# Patient Record
Sex: Male | Born: 1985 | Race: White | Hispanic: No | Marital: Single | Smoking: Current every day smoker
Health system: Southern US, Community
[De-identification: ages and names within clinical notes are randomized; demographics above are authoritative.]

## PROBLEM LIST (undated history)

## (undated) DIAGNOSIS — F909 Attention-deficit hyperactivity disorder, unspecified type: Secondary | ICD-10-CM

## (undated) HISTORY — PX: KNEE SURGERY: SHX244

## (undated) HISTORY — PX: HERNIA REPAIR: SHX51

---
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
## 2010-02-10 ENCOUNTER — Encounter
Admission: RE | Admit: 2010-02-10 | Discharge: 2010-02-10 | Payer: Self-pay | Source: Home / Self Care | Attending: Family Medicine | Admitting: Family Medicine

## 2010-02-22 ENCOUNTER — Encounter: Payer: Self-pay | Admitting: Family Medicine

## 2010-06-09 NOTE — Op Note (Signed)
NAME:  Lance Stewart, AUTHEMENT NO.:  [id number]   MEDICAL RECORD NO.:  [id number]                   PATIENT TYPE:  AMB   LOCATION:  DSC                                  FACILITY:  [facility]   PHYSICIAN:  Loreta Ave, M.D.              DATE OF BIRTH:  [birth year]   DATE OF PROCEDURE:  10/23/2002  DATE OF DISCHARGE:                                 OPERATIVE REPORT   PREOPERATIVE DIAGNOSES:  1. Acute patellofemoral dislocation with traumatic chondral injury, patella     left knee.  2. Torn medial patellofemoral ligament near the femoral attachment.     Underlying ligamentous laxity.  3. Status post  previous  anterior cruciate ligament repair with good     integrity and stability of remaining cruciate ligament.  4. Numerous chondral loose bodies.   POSTOPERATIVE DIAGNOSES:  1. Acute patellofemoral dislocation with traumatic chondral injury, patella     left knee.  2. Torn medial patellofemoral ligament near the femoral attachment.     Underlying ligamentous laxity.  3. Status post  previous  anterior cruciate ligament repair with good     integrity and stability of remaining cruciate ligament.  4. Numerous chondral loose bodies.  5. Evidence of significant grade 3 chondral injury to the medial patellar     facet  with numerous chondral loose bodies.   PROCEDURE PERFORMED:  1. Left knee exam under anesthesia.  2. Arthroscopy removal of chondral loose bodies with chondroplasty.  3. Assessment of the anterior cruciate ligament.  4. Open repair of medial patellofemoral ligament near femoral attachment,     anatomic position  with nonabsorbable Ethibond suture.   SURGEON:  Loreta Ave, M.D.   ASSISTANT:  Arlys John D. Petrarca, P.A.-C.   ANESTHESIA:  General.   ESTIMATED BLOOD LOSS:  Minimal.   TOURNIQUET TIME:  1 hours.   SPECIMENS:  No specimens, no cultures.   DRESSING:  Sterile compressive knee immobilizer.   DESCRIPTION OF PROCEDURE:  The  patient was brought to the operating room and  placed on the operating table in the supine position. After adequate  anesthesia had been obtained both knees were examined. Although he has  increased excursion with testing of all ligaments, as well as recurvatum,  both knees, from generalized ligamentous laxity, both knees could be easily  assessed. Patella alta with subluximal patellofemoral joints, both knees,  much more pronounced due to the acute injury left knee. Negative Lachman and  drawer but fair amount of excursion before endpoint on ACL testing both  sides. Collateral stable both sides. Patellofemoral instability with tearing  of the medial structures of the left knee fairly obvious as well as crepitus  on patellofemoral assessing left knee.   A tourniquet was applied to the upper  aspect of the left leg. Prepped and  draped in the usual sterile fashion. Exsanguinated with elevation of the  Esmarch tourniquet  elevated to 300 mmHg. Three portals were created, 1  superolateral, 1 each medial and lateral peripatellar. The inflow catheter  was introduced ____________.   Numerous chondral loose bodies were treated throughout. A grade 3 injury to  the medial patellar facet and peak of the patella. Thorough chondroplasty to  a stable surface. Thinning but still  remaining articular cartilage  throughout and no bony injury. Grade 2 lesion  in the middle of the  trochlea. The remaining structures were assessed. Menisci intact. Medial and  lateral  compartments intact. The ACL had good integrity, fixation and  stability as did the PCL. Obvious lateral tracking but did not significantly  tether laterally. The trochlear groove was relatively flat.   Once I had good confirmation of pathology the instruments and fluid were  removed. A longitudinal incision along the femoral attachment of the  patellofemoral medially. The skin and subcutaneous tissue and deep fascia  were  incised. The  medial retinaculum and patellofemoral ligament were  exposed. This was mobilized. There was still soft tissue  margin in front of  the medial collateral ligament to allow primary soft tissue repair.   A series of 4 Ethibond sutures were placed in the medial patellofemoral  ligament and in the anterior aspect of the medial collateral ligament and  periosteum. These were all placed in a Bunnell manner to capture the  patellofemoral ligament and then firmly tied in place, restoring good  stability on the medial side. I could bring up to 90 degrees of flexion  without undue tension. Marked improvement of patellofemoral stability  returning it to normal tracking. The overlying fascia was closed with  Vicryl.   The arthroscope was  reintroduced. Confirmed all loose bodies had been  removed and also confirmed good positioning of the patellofemoral joint  after a paramedial retinaculum obliterating the traumatic instability that  had occurred with this dislocation. The instruments and fluid were removed.   The portals and knee and incision were injected with Marcaine. The portals  were closed with 4-0 nylon. The incision was closed with subcutaneous  subcuticular Vicryl and Steri-Strips. A sterile compressive dressing was  applied. The tourniquet was deflated and removed. A knee immobilizer was  applied.   Anesthesia was reversed. The patient was brought to the recovery room having  tolerated  the procedure well without complications.                                                Loreta Ave, M.D.    DFM/MEDQ  D:  10/23/2002  T:  10/24/2002  Job:  [id number]

## 2010-07-14 ENCOUNTER — Emergency Department (HOSPITAL_COMMUNITY): Payer: No Typology Code available for payment source

## 2010-07-14 ENCOUNTER — Emergency Department (HOSPITAL_COMMUNITY)
Admission: EM | Admit: 2010-07-14 | Discharge: 2010-07-14 | Disposition: A | Discharge status: Home / Self Care | Payer: No Typology Code available for payment source | Attending: Emergency Medicine | Admitting: Emergency Medicine

## 2010-07-14 DIAGNOSIS — S0990XA Unspecified injury of head, initial encounter: Secondary | ICD-10-CM | POA: Insufficient documentation

## 2010-07-14 DIAGNOSIS — R51 Headache: Secondary | ICD-10-CM | POA: Insufficient documentation

## 2010-07-14 DIAGNOSIS — R404 Transient alteration of awareness: Secondary | ICD-10-CM | POA: Insufficient documentation

## 2010-07-14 DIAGNOSIS — S0180XA Unspecified open wound of other part of head, initial encounter: Secondary | ICD-10-CM | POA: Insufficient documentation

## 2010-07-14 DIAGNOSIS — S0510XA Contusion of eyeball and orbital tissues, unspecified eye, initial encounter: Secondary | ICD-10-CM | POA: Insufficient documentation

## 2011-12-15 IMAGING — US US PELVIS LIMITED
1 series · 14 of 24 positions shown · non-contrast
Comparison: None

CLINICAL DATA: Urinary frequency.  Pressure, intermittent pain.
Symptoms for 1 year.  Post void residual requested.

US PELVIS LIMITED OR FOLLOW UP

[Series 1: us pelvis limited · 0.22mm/px · 14 of 24 slices shown]
[im 1/24]
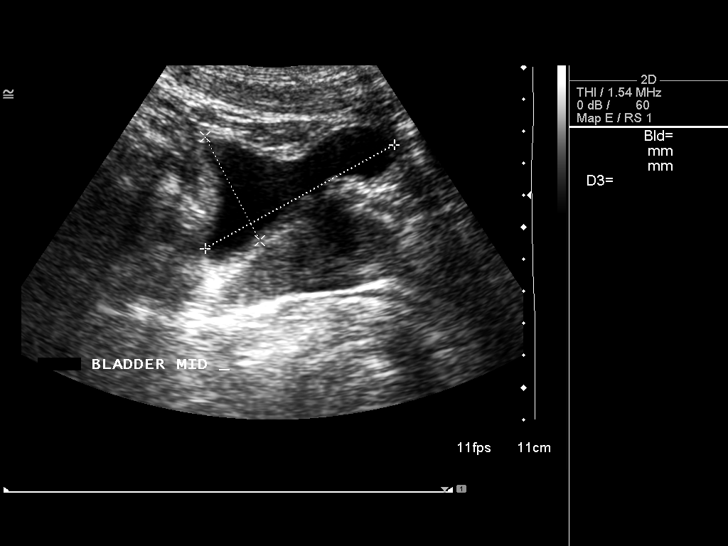
[im 3/24]
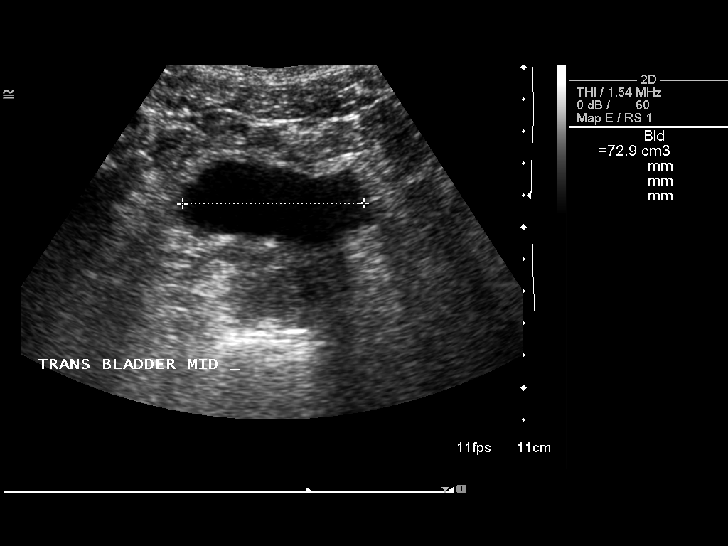
[im 5/24]
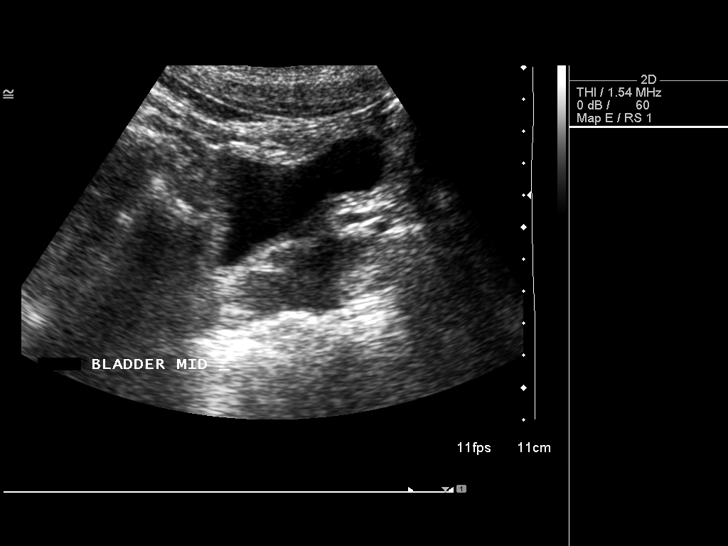
[im 7/24]
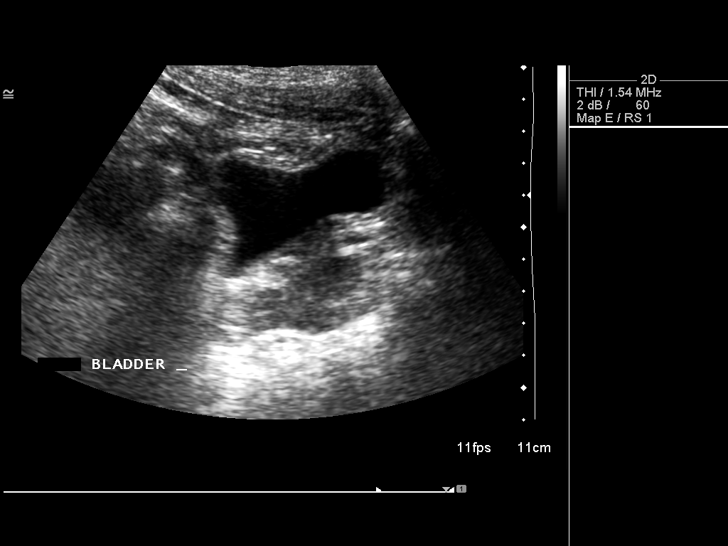
[im 8/24]
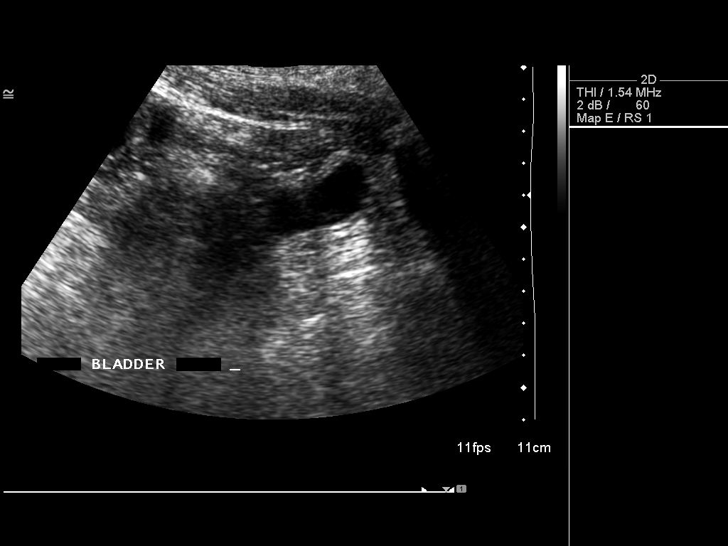
[im 10/24]
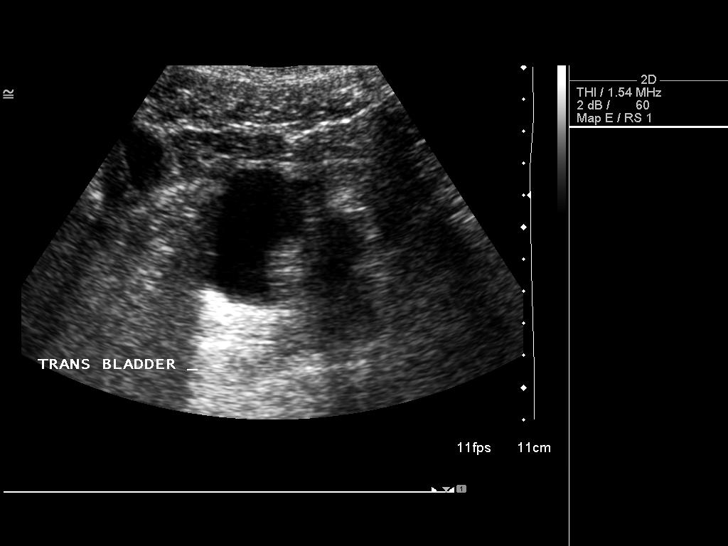
[im 12/24]
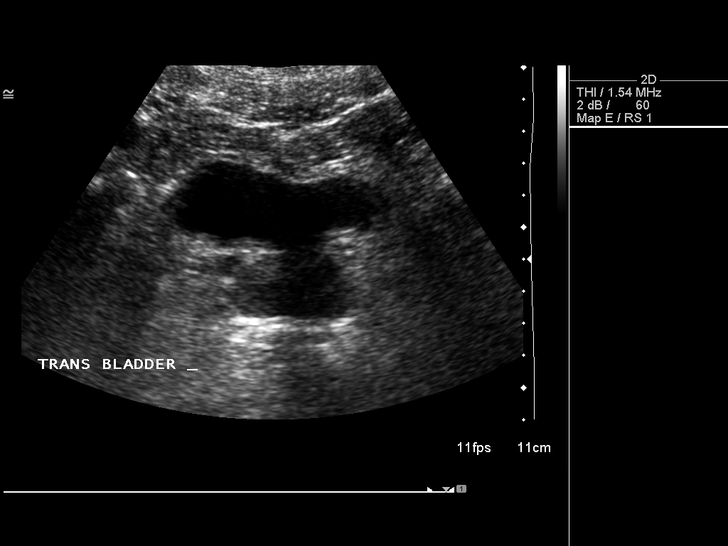
[im 13/24]
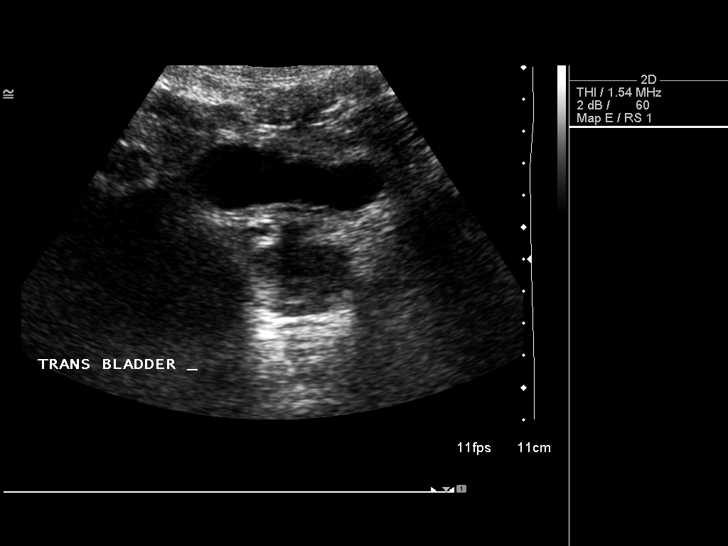
[im 15/24]
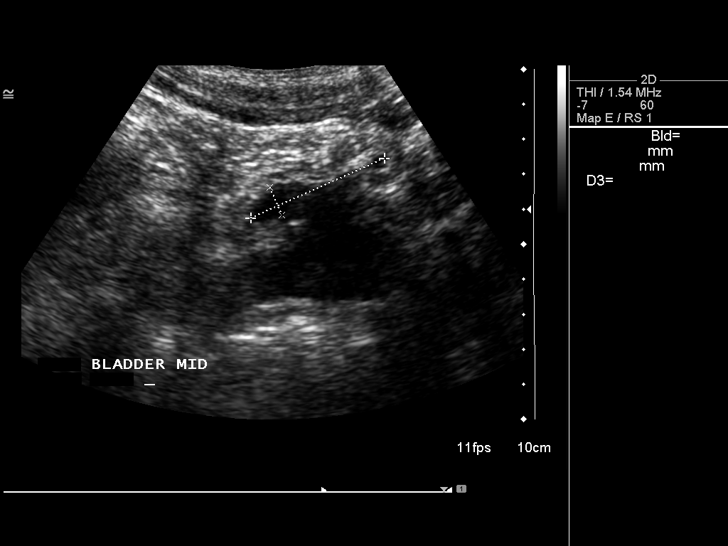
[im 17/24]
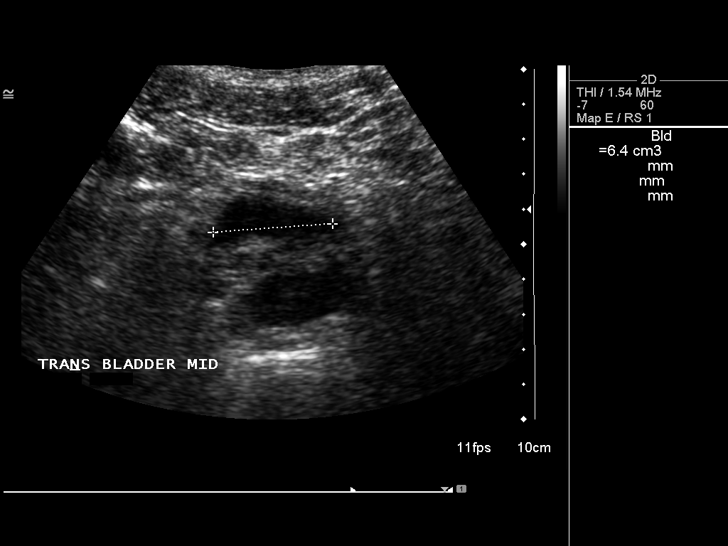
[im 19/24]
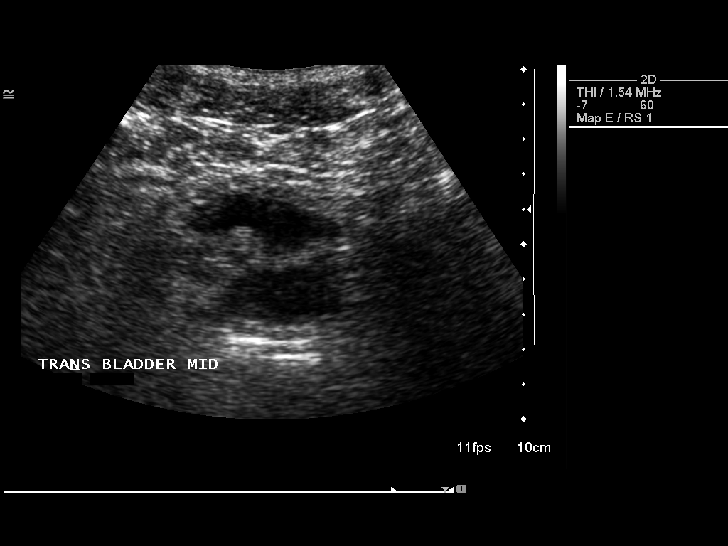
[im 20/24]
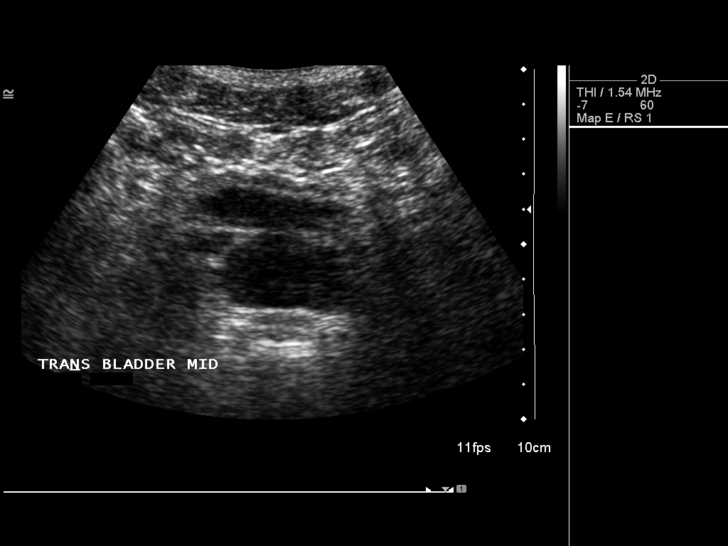
[im 22/24]
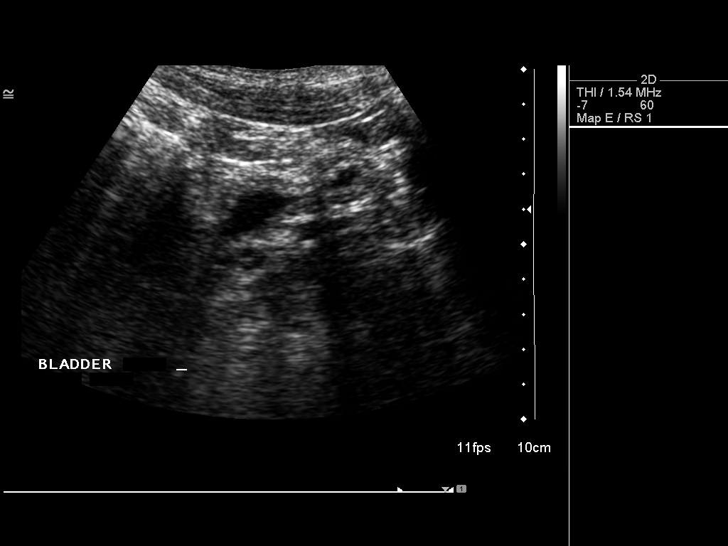
[im 24/24]
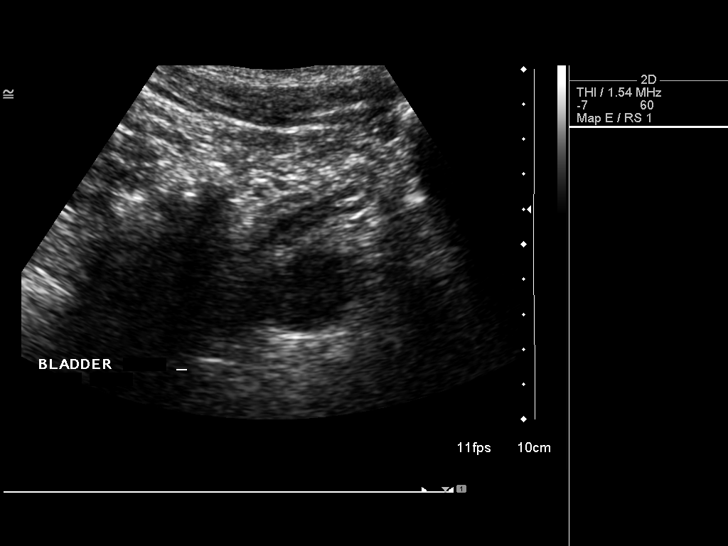

[14 of 24 positions shown; findings below may reference images not displayed]

FINDINGS: Images of the bladder are performed prior to and
following voiding.  The bladder is incompletely distended prior to
voiding.

Prior to voiding, the bladder volume is 73 ml.  Following voiding,
the bladder volume is 6 ml.

No mass is visualized.
IMPRESSION: 1.  Postvoid residual is 6 ml.
2.  No bladder abnormality identified.
3.  Bladder is not completely distended prior to voiding.

## 2012-09-17 ENCOUNTER — Encounter (HOSPITAL_COMMUNITY): Payer: Self-pay

## 2012-09-17 ENCOUNTER — Emergency Department (HOSPITAL_COMMUNITY)
Admission: EM | Admit: 2012-09-17 | Discharge: 2012-09-18 | Disposition: A | Discharge status: Home / Self Care | Payer: Self-pay | Attending: Emergency Medicine | Admitting: Emergency Medicine

## 2012-09-17 DIAGNOSIS — Y92009 Unspecified place in unspecified non-institutional (private) residence as the place of occurrence of the external cause: Secondary | ICD-10-CM | POA: Insufficient documentation

## 2012-09-17 DIAGNOSIS — T401X4A Poisoning by heroin, undetermined, initial encounter: Secondary | ICD-10-CM | POA: Insufficient documentation

## 2012-09-17 DIAGNOSIS — T401X1A Poisoning by heroin, accidental (unintentional), initial encounter: Secondary | ICD-10-CM | POA: Insufficient documentation

## 2012-09-17 DIAGNOSIS — Y9389 Activity, other specified: Secondary | ICD-10-CM | POA: Insufficient documentation

## 2012-09-17 DIAGNOSIS — F17200 Nicotine dependence, unspecified, uncomplicated: Secondary | ICD-10-CM | POA: Insufficient documentation

## 2012-09-17 DIAGNOSIS — Z8659 Personal history of other mental and behavioral disorders: Secondary | ICD-10-CM | POA: Insufficient documentation

## 2012-09-17 DIAGNOSIS — F1010 Alcohol abuse, uncomplicated: Secondary | ICD-10-CM | POA: Insufficient documentation

## 2012-09-17 HISTORY — DX: Attention-deficit hyperactivity disorder, unspecified type: F90.9

## 2012-09-17 LAB — CBC WITH DIFFERENTIAL/PLATELET
Basophils Absolute: 0 10*3/uL (ref 0.0–0.1)
Basophils Relative: 0 % (ref 0–1)
Eosinophils Absolute: 0.1 10*3/uL (ref 0.0–0.7)
Eosinophils Relative: 2 % (ref 0–5)
Lymphocytes Relative: 17 % (ref 12–46)
MCH: 29 pg (ref 26.0–34.0)
MCHC: 35.2 g/dL (ref 30.0–36.0)
MCV: 82.3 fL (ref 78.0–100.0)
Platelets: 167 10*3/uL (ref 150–400)
RDW: 13.2 % (ref 11.5–15.5)
WBC: 6.2 10*3/uL (ref 4.0–10.5)

## 2012-09-17 LAB — BASIC METABOLIC PANEL
Calcium: 9 mg/dL (ref 8.4–10.5)
GFR calc Af Amer: >90 mL/min (ref >90)
GFR calc non Af Amer: >90 mL/min (ref >90)
Sodium: 136 mEq/L (ref 135–145)

## 2012-09-17 LAB — RAPID URINE DRUG SCREEN, HOSP PERFORMED
Cocaine: NOT DETECTED
Opiates: POSITIVE — AB

## 2012-09-17 MED ORDER — PROMETHAZINE HCL 25 MG PO TABS: 25.0000 mg | ORAL_TABLET | Freq: Four times a day (QID) | ORAL | Status: DC | PRN

## 2012-09-17 MED ORDER — LOPERAMIDE HCL 2 MG PO CAPS: 2.0000 mg | ORAL_CAPSULE | Freq: Four times a day (QID) | ORAL | Status: DC | PRN

## 2012-09-17 NOTE — ED Provider Notes (Signed)
CSN: [id number]     Arrival date & time 09/17/12  2053 History   First MD Initiated Contact with Patient 09/17/12 2112     Chief Complaint  Patient presents with  . Drug Overdose   (Consider location/radiation/quality/duration/timing/severity/associated sxs/prior Treatment) HPI  Lance Stewart is a 27 y.o. male no significant past medical history brought in by EMS for heroin overdose. Patient was found laying on the floor at home with agonal respirations by his girlfriend. EMS gave 2 mg of Narcan in route with improvement. Patient states that this is his first time ever using heroin, he states he injected it. Denies any other illicit drug use, excessive alcohol use. Patient is adamant that this was not a suicide attempt patient denies any prior suicide attempt.   Past Medical History  Diagnosis Date  . ADHD (attention deficit hyperactivity disorder)    Past Surgical History  Procedure Laterality Date  . Knee surgery     No family history on file. History  Substance Use Topics  . Smoking status: Current Every Day Smoker    Types: Cigarettes  . Smokeless tobacco: Not on file  . Alcohol Use: Yes   Review of Systems  10 systems reviewed and found to be negative, except as noted in the HPI   Allergies  Review of patient's allergies indicates not on file.  Home Medications  No current outpatient prescriptions on file. BP 140/78  Pulse 107  Temp(Src) 98.6 F (37 C) (Oral)  Resp 12  Ht 6\' 1"  (1.854 m)  Wt 190 lb (86.183 kg)  BMI 25.07 kg/m2  SpO2 100% Physical Exam  Nursing note and vitals reviewed. Constitutional: He is oriented to person, place, and time. He appears well-developed and well-nourished. No distress.  HENT:  Head: Normocephalic and atraumatic.  Mouth/Throat: Oropharynx is clear and moist.  Eyes: Conjunctivae and EOM are normal.  Pupils constricted but reactive  Neck: Normal range of motion.  Cardiovascular: Normal rate, regular rhythm and normal heart  sounds.   Pulmonary/Chest: Effort normal and breath sounds normal. No stridor. No respiratory distress. He has no wheezes. He has no rales. He exhibits no tenderness.  Abdominal: Soft. Bowel sounds are normal. He exhibits no distension and no mass. There is no tenderness. There is no rebound and no guarding.  Musculoskeletal: Normal range of motion.  Neurological: He is alert and oriented to person, place, and time.  GCS 15  Psychiatric: He has a normal mood and affect.    ED Course  Procedures (including critical care time) Labs Review Labs Reviewed  BASIC METABOLIC PANEL - Abnormal; Notable for the following:    Potassium 3.2 (*)    Glucose, Bld 196 (*)    All other components within normal limits  URINE RAPID DRUG SCREEN (HOSP PERFORMED) - Abnormal; Notable for the following:    Opiates POSITIVE (*)    Amphetamines POSITIVE (*)    Tetrahydrocannabinol POSITIVE (*)    All other components within normal limits  SALICYLATE LEVEL - Abnormal; Notable for the following:    Salicylate Lvl <2.0 (*)    All other components within normal limits  CBC WITH DIFFERENTIAL  ACETAMINOPHEN LEVEL   Imaging Review  Date: 09/18/2012  Rate:106  Rhythm: Sinus tach  QRS Axis: normal  Intervals: normal  ST/T Wave abnormalities: normal  Conduction Disutrbances:none  Narrative Interpretation:   Old EKG Reviewed: Unavailable  No results found.  MDM   1. Heroin overdose, initial encounter    Filed Vitals:  09/17/12 2105  BP: 140/78  Pulse: 107  Temp: 98.6 F (37 C)  TempSrc: Oral  Resp: 12  Height: 6\' 1"  (1.854 m)  Weight: 190 lb (86.183 kg)  SpO2: 100%     Lance Stewart is a 27 y.o. male accidental overdose of IV heroin earlier in the day. Reversed with Narcan. Patient denies suicidal ideation. Urine drug screen shows opiates amphetamines and THC.  Patient has a mild sinus tachycardia on EKG, he is alert, oriented x3 with a GCS of 15. Patient is tolerating by mouth and and without  assistance. He is in the care of his father whom he will be discharged home with.  Discussed case with attending who agrees with plan and stability to d/c to home.   Pt is hemodynamically stable, appropriate for, and amenable to discharge at this time. Pt verbalized understanding and agrees with care plan. All questions answered. Outpatient follow-up and specific return precautions discussed.    New Prescriptions   LOPERAMIDE (IMODIUM) 2 MG CAPSULE    Take 1 capsule (2 mg total) by mouth 4 (four) times daily as needed for diarrhea or loose stools.   PROMETHAZINE (PHENERGAN) 25 MG TABLET    Take 1 tablet (25 mg total) by mouth every 6 (six) hours as needed for nausea.    Note: Portions of this report may have been transcribed using voice recognition software. Every effort was made to ensure accuracy; however, inadvertent computerized transcription errors may be present      Wynetta Emery, PA-C 09/18/12 [phone]

## 2012-09-17 NOTE — ED Notes (Signed)
Pt found by EMS lying in floor of home with agonal respirations. Found by girlfriend. Given 2mg  of narcan and became responsive. Admit to using heroin, first time user.

## 2012-09-17 NOTE — ED Notes (Signed)
Pt tried to urinate and was not successful.  Will try again in 30 minutes.

## 2012-09-17 NOTE — ED Notes (Signed)
Bed: ZO10 Expected date:  Expected time:  Means of arrival:  Comments: EMS 26yo M; Heroin Overdose

## 2012-09-18 ENCOUNTER — Encounter (HOSPITAL_COMMUNITY): Payer: Self-pay

## 2012-09-18 NOTE — ED Notes (Signed)
Pt drank 240 cc. Tolerated well.

## 2012-09-18 NOTE — ED Notes (Signed)
Pt ambulated 100 ft without assistance.  Pt maintained a steady gait.  Vital signs remained within normal limits.

## 2012-09-23 NOTE — ED Provider Notes (Signed)
Medical screening examination/treatment/procedure(s) were performed by non-physician practitioner and as supervising physician I was immediately available for consultation/collaboration.   Derwood Kaplan, MD 09/23/12 1701

## 2012-12-23 ENCOUNTER — Encounter (HOSPITAL_COMMUNITY): Payer: Self-pay | Admitting: Emergency Medicine

## 2012-12-23 ENCOUNTER — Emergency Department (HOSPITAL_COMMUNITY)
Admission: EM | Admit: 2012-12-23 | Discharge: 2012-12-23 | Disposition: A | Discharge status: Home / Self Care | Payer: Self-pay | Attending: Emergency Medicine | Admitting: Emergency Medicine

## 2012-12-23 DIAGNOSIS — F909 Attention-deficit hyperactivity disorder, unspecified type: Secondary | ICD-10-CM | POA: Insufficient documentation

## 2012-12-23 DIAGNOSIS — S51809A Unspecified open wound of unspecified forearm, initial encounter: Secondary | ICD-10-CM | POA: Insufficient documentation

## 2012-12-23 DIAGNOSIS — W268XXA Contact with other sharp object(s), not elsewhere classified, initial encounter: Secondary | ICD-10-CM | POA: Insufficient documentation

## 2012-12-23 DIAGNOSIS — S41101A Unspecified open wound of right upper arm, initial encounter: Secondary | ICD-10-CM

## 2012-12-23 DIAGNOSIS — S81009A Unspecified open wound, unspecified knee, initial encounter: Secondary | ICD-10-CM | POA: Insufficient documentation

## 2012-12-23 DIAGNOSIS — F17200 Nicotine dependence, unspecified, uncomplicated: Secondary | ICD-10-CM | POA: Insufficient documentation

## 2012-12-23 DIAGNOSIS — Y9389 Activity, other specified: Secondary | ICD-10-CM | POA: Insufficient documentation

## 2012-12-23 DIAGNOSIS — S81812A Laceration without foreign body, left lower leg, initial encounter: Secondary | ICD-10-CM

## 2012-12-23 DIAGNOSIS — Y9289 Other specified places as the place of occurrence of the external cause: Secondary | ICD-10-CM | POA: Insufficient documentation

## 2012-12-23 DIAGNOSIS — Z23 Encounter for immunization: Secondary | ICD-10-CM | POA: Insufficient documentation

## 2012-12-23 DIAGNOSIS — Z79899 Other long term (current) drug therapy: Secondary | ICD-10-CM | POA: Insufficient documentation

## 2012-12-23 MED ORDER — LIDOCAINE-EPINEPHRINE-TETRACAINE (LET) SOLUTION
3.0000 mL | Freq: Once | NASAL | Status: AC
  Administered 2012-12-23: 3 mL via TOPICAL
  Filled 2012-12-23: qty 3

## 2012-12-23 MED ORDER — BACITRACIN ZINC 500 UNIT/GM EX OINT
1.0000 "application " | TOPICAL_OINTMENT | Freq: Two times a day (BID) | CUTANEOUS | Status: DC
  Administered 2012-12-23: 1 via TOPICAL

## 2012-12-23 MED ORDER — TETANUS-DIPHTH-ACELL PERTUSSIS 5-2.5-18.5 LF-MCG/0.5 IM SUSP
0.5000 mL | Freq: Once | INTRAMUSCULAR | Status: AC
  Administered 2012-12-23: 0.5 mL via INTRAMUSCULAR
  Filled 2012-12-23: qty 0.5

## 2012-12-23 NOTE — ED Provider Notes (Signed)
CSN: [id number]     Arrival date & time 12/23/12  1545 History   First MD Initiated Contact with Patient 12/23/12 1624   This chart was scribed for non-physician practitioner Francee Piccolo, PA-C working with Gilda Crease, MD by Valera Castle, ED scribe. This patient was seen in room WTR8/WTR8 and the patient's care was started at 4:45 PM.   Chief Complaint  Patient presents with  . Extremity Laceration    1 cm deep laceration on l/knee, 2 avulsions tears on r/fore arm    The history is provided by the patient. No language interpreter was used.   HPI Comments: Lance Stewart is a 27 y.o. male who presents to the Emergency Department complaining of multiple lacerations to his right forearm and left knee, with associated sudden, moderate, constant pain to the areas, onset earlier this afternoon when he was doing yard work and cut himself with a Naval architect. He reports a pain severity of 8/10 to the lacerations on his right forearm. He denies knowing if his tetanus is UTD. He reports being otherwise healthy, and denies numbness, tingling, fever, chills, and any other associated symptoms. He denies any pertinent medical history.  PCP - No primary provider on file.  Past Medical History  Diagnosis Date  . ADHD (attention deficit hyperactivity disorder)    Past Surgical History  Procedure Laterality Date  . Knee surgery    . Knee surgery    . Hernia repair     History reviewed. No pertinent family history. History  Substance Use Topics  . Smoking status: Current Every Day Smoker    Types: Cigarettes  . Smokeless tobacco: Not on file  . Alcohol Use: Yes    Review of Systems  Constitutional: Negative for fever and chills.  Skin: Positive for wound (lacerations to right forearm, left knee).  Neurological: Negative for numbness (tingling).  All other systems reviewed and are negative.   Allergies  Review of patient's allergies indicates no known  allergies.  Home Medications   Current Outpatient Rx  Name  Route  Sig  Dispense  Refill  . amphetamine-dextroamphetamine (ADDERALL) 20 MG tablet   Oral   Take 20 mg by mouth 2 (two) times daily.          BP 133/77  Pulse 84  Temp(Src) 98.9 F (37.2 C) (Oral)  Resp 18  SpO2 99%  Physical Exam  Constitutional: He is oriented to person, place, and time. He appears well-developed and well-nourished. No distress.  HENT:  Head: Normocephalic and atraumatic.  Right Ear: External ear normal.  Left Ear: External ear normal.  Nose: Nose normal.  Mouth/Throat: Oropharynx is clear and moist.  Eyes: Conjunctivae are normal.  Neck: Normal range of motion. Neck supple.  Cardiovascular: Normal rate.   Pulmonary/Chest: Effort normal.  Abdominal: Soft. There is no tenderness.  Musculoskeletal: Normal range of motion.  Neurological: He is alert and oriented to person, place, and time.  Skin: Skin is warm and dry. He is not diaphoretic.  1 cm left anterior knee.  Two small avulsion tears on right forearm.   Psychiatric: He has a normal mood and affect.    ED Course  Procedures (including critical care time) Medications  bacitracin ointment 1 application (1 application Topical Given 12/23/12 1834)  lidocaine-EPINEPHrine-tetracaine (LET) solution (3 mLs Topical Given 12/23/12 1729)  Tdap (BOOSTRIX) injection 0.5 mL (0.5 mLs Intramuscular Given 12/23/12 1823)     DIAGNOSTIC STUDIES: Oxygen Saturation is 99% on room air,  normal by my interpretation.    COORDINATION OF CARE: 4:49 PM-Discussed treatment plan which includes wound cleaning and repair with pt at bedside and pt agreed to plan.   Labs Review Labs Reviewed - No data to display Imaging Review No results found.  EKG Interpretation   None      LACERATION REPAIR Performed by: Jeannetta Ellis Authorized by: Jeannetta Ellis Consent: Verbal consent obtained. Risks and benefits: risks, benefits and  alternatives were discussed Consent given by: patient Patient identity confirmed: provided demographic data Prepped and Draped in normal sterile fashion Wound explored  Laceration Location: left anterior shin  Laceration Length: 1cm  No Foreign Bodies seen or palpated  Anesthesia: local infiltration  Local anesthetic: lidocaine 2% w/ epinephrine  Anesthetic total: 5 ml  Irrigation method: syringe Amount of cleaning: standard  Skin closure: 4-0 prolene  Number of sutures: 3  Technique: simple interrupted  Patient tolerance: Patient tolerated the procedure well with no immediate complications.  MDM   1. Laceration of left lower extremity, initial encounter   2. Avulsion of skin of upper extremity, right, initial encounter     Afebrile, NAD, non-toxic appearing, AAOx4. Tdap booster given. Wound cleaning complete with pressure irrigation, bottom of wound visualized, no foreign bodies appreciated. Laceration occurred < 8 hours prior to repair which was well tolerated. Pt has no co morbidities to effect normal wound healing. Discussed suture home care w pt and answered questions. Pt to f-u for wound check and suture removal in 7 days. Skin avulsions cleansed and covered. Pt is hemodynamically stable w no complaints prior to dc.    I personally performed the services described in this documentation, which was scribed in my presence. The recorded information has been reviewed and is accurate.     Jeannetta Ellis, PA-C 12/23/12 1910
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
## 2012-12-23 NOTE — ED Provider Notes (Signed)
Medical screening examination/treatment/procedure(s) were performed by non-physician practitioner and as supervising physician I was immediately available for consultation/collaboration.  EKG Interpretation   None         Joseph Johns J. Birtha Hatler, MD 12/23/12 2028 

## 2014-07-09 ENCOUNTER — Emergency Department (HOSPITAL_COMMUNITY)
Admission: EM | Admit: 2014-07-09 | Discharge: 2014-07-10 | Disposition: A | Discharge status: Home / Self Care | Payer: Self-pay | Attending: Emergency Medicine | Admitting: Emergency Medicine

## 2014-07-09 ENCOUNTER — Encounter (HOSPITAL_COMMUNITY): Payer: Self-pay | Admitting: Emergency Medicine

## 2014-07-09 DIAGNOSIS — Z041 Encounter for examination and observation following transport accident: Secondary | ICD-10-CM | POA: Insufficient documentation

## 2014-07-09 DIAGNOSIS — Y92410 Unspecified street and highway as the place of occurrence of the external cause: Secondary | ICD-10-CM | POA: Insufficient documentation

## 2014-07-09 DIAGNOSIS — Y9389 Activity, other specified: Secondary | ICD-10-CM | POA: Insufficient documentation

## 2014-07-09 DIAGNOSIS — Z720 Tobacco use: Secondary | ICD-10-CM | POA: Insufficient documentation

## 2014-07-09 DIAGNOSIS — T43621A Poisoning by amphetamines, accidental (unintentional), initial encounter: Secondary | ICD-10-CM | POA: Insufficient documentation

## 2014-07-09 DIAGNOSIS — F909 Attention-deficit hyperactivity disorder, unspecified type: Secondary | ICD-10-CM | POA: Insufficient documentation

## 2014-07-09 DIAGNOSIS — Z79899 Other long term (current) drug therapy: Secondary | ICD-10-CM | POA: Insufficient documentation

## 2014-07-09 DIAGNOSIS — T424X1A Poisoning by benzodiazepines, accidental (unintentional), initial encounter: Secondary | ICD-10-CM | POA: Insufficient documentation

## 2014-07-09 DIAGNOSIS — Y998 Other external cause status: Secondary | ICD-10-CM | POA: Insufficient documentation

## 2014-07-09 DIAGNOSIS — T401X1A Poisoning by heroin, accidental (unintentional), initial encounter: Secondary | ICD-10-CM | POA: Insufficient documentation
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
## 2014-07-10 ENCOUNTER — Emergency Department (HOSPITAL_COMMUNITY): Payer: Self-pay

## 2014-07-10 LAB — CBC WITH DIFFERENTIAL/PLATELET
BASOS ABS: 0 10*3/uL (ref 0.0–0.1)
BASOS PCT: 0 % (ref 0–1)
EOS ABS: 0.3 10*3/uL (ref 0.0–0.7)
Eosinophils Relative: 5 % (ref 0–5)
HCT: 35.5 % — ABNORMAL LOW (ref 39.0–52.0)
Hemoglobin: 12.1 g/dL — ABNORMAL LOW (ref 13.0–17.0)
Lymphocytes Relative: 35 % (ref 12–46)
Lymphs Abs: 1.8 10*3/uL (ref 0.7–4.0)
MCH: 27.8 pg (ref 26.0–34.0)
MCHC: 34.1 g/dL (ref 30.0–36.0)
MCV: 81.6 fL (ref 78.0–100.0)
MONO ABS: 0.3 10*3/uL (ref 0.1–1.0)
Monocytes Relative: 6 % (ref 3–12)
NEUTROS ABS: 2.8 10*3/uL (ref 1.7–7.7)
NEUTROS PCT: 54 % (ref 43–77)
Platelets: 160 10*3/uL (ref 150–400)
RBC: 4.35 MIL/uL (ref 4.22–5.81)
RDW: 12.8 % (ref 11.5–15.5)
WBC: 5.2 10*3/uL (ref 4.0–10.5)

## 2014-07-10 LAB — COMPREHENSIVE METABOLIC PANEL
ALBUMIN: 3.5 g/dL (ref 3.5–5.0)
ALT: 22 U/L (ref 17–63)
ANION GAP: 8 (ref 5–15)
AST: 26 U/L (ref 15–41)
Alkaline Phosphatase: 55 U/L (ref 38–126)
BUN: 10 mg/dL (ref 6–20)
CALCIUM: 8.8 mg/dL — AB (ref 8.9–10.3)
CO2: 27 mmol/L (ref 22–32)
CREATININE: 1.08 mg/dL (ref 0.61–1.24)
Chloride: 103 mmol/L (ref 101–111)
GFR calc Af Amer: >60 mL/min (ref >60)
Glucose, Bld: 109 mg/dL — ABNORMAL HIGH (ref 65–99)
Potassium: 3.4 mmol/L — ABNORMAL LOW (ref 3.5–5.1)
Sodium: 138 mmol/L (ref 135–145)
Total Bilirubin: 0.5 mg/dL (ref 0.3–1.2)
Total Protein: 6.2 g/dL — ABNORMAL LOW (ref 6.5–8.1)

## 2014-07-10 MED ORDER — PROMETHAZINE HCL 25 MG RE SUPP: 25.0000 mg | Freq: Four times a day (QID) | RECTAL | Status: AC | PRN

## 2014-07-10 MED ORDER — CLONIDINE HCL 0.1 MG PO TABS: 0.1000 mg | ORAL_TABLET | Freq: Four times a day (QID) | ORAL | Status: AC | PRN

## 2014-07-10 NOTE — Discharge Instructions (Signed)
°Emergency Department Resource Guide °1) Find a Doctor and Pay Out of Pocket °Although you won't have to find out who is covered by your insurance plan, it is a good idea to ask around and get recommendations. You will then need to call the office and see if the doctor you have chosen will accept you as a new patient and what types of options they offer for patients who are self-pay. Some doctors offer discounts or will set up payment plans for their patients who do not have insurance, but you will need to ask so you aren't surprised when you get to your appointment. ° °2) Contact Your Local Health Department °Not all health departments have doctors that can see patients for sick visits, but many do, so it is worth a call to see if yours does. If you don't know where your local health department is, you can check in your phone book. The CDC also has a tool to help you locate your state's health department, and many state websites also have listings of all of their local health departments. ° °3) Find a Walk-in Clinic °If your illness is not likely to be very severe or complicated, you may want to try a walk in clinic. These are popping up all over the country in pharmacies, drugstores, and shopping centers. They're usually staffed by nurse practitioners or physician assistants that have been trained to treat common illnesses and complaints. They're usually fairly quick and inexpensive. However, if you have serious medical issues or chronic medical problems, these are probably not your best option. ° °No Primary Care Doctor: °- Call [facility] at  832-8000 - they can help you locate a primary care doctor that  accepts your insurance, provides certain services, etc. °- Physician Referral Service- 1-800-533-3463 ° °Chronic Pain Problems: °Organization         Address  Phone   Notes  °[facility]  (336) 297-2271 Patients need to be referred by their primary care doctor.  ° °Medication  Assistance: °Organization         Address  Phone   Notes  °[place] [facility] [address] °[place], [place] [address] (336) 641-8030 --Must be a resident of [place] °-- Must have NO insurance coverage whatsoever (no Medicaid/ Medicare, etc.) °-- The pt. MUST have a primary care doctor that directs their care regularly and follows them in the community °  °[facility]  (866) 331-1348   °[employer]  (888) 892-1162   ° °Agencies that provide inexpensive medical care: °Organization         Address  Phone   Notes  °[facility]  (336) 832-8035   °[facility]    (336) 832-7272   °[facility] [address] °[place], [place] [address] (336) 832-4777   °[facility] [address], °[place] (336) 271-4999   °[facility]    (336) 373-0678   °[facility]    (336) 272-1050   °[facility] ° [address], [place] Phone:  (336) 832-4444, Fax:  (336) 832-4440 Hours of Operation:  9 am - 6 pm, M-F.  Also accepts Medicaid/Medicare and self-pay.  ° [facility] ° [address], [place] Phone: (336) 832-3150, Fax: (336) [phone]. Hours of Operation:  8:30 am - 5:30 pm, M-F.  Also accepts Medicaid and self-pay.  °[facility] [place] [address], [place] Phone: (336) 878-6027   °[facility] [address], [place], [place] (336)723-1848, Ext. 123 Mondays & Thursdays: 7-9 AM.  First 15 patients are seen on a first come, first serve basis. °  ° °Medicaid-accepting [place] Providers: ° °Organization         Address  Phone   Notes  °[facility] [address], [place] (336) 641-2100 Also accepts self-pay patients.  °[facility] [address], [place] ° (336) 856-9996   °[facility] [address], [place]  (336) 288-8857   °[facility] [address] [place] [address], [place] (336) 299-7000   °Veita Bland [address], [place]  ° (336) 373-1557 Only accepts [place] Access Medicaid patients after they have their name applied to their card.  ° °Self-Pay (no insurance) in [place]: ° °Organization         Address  Phone   Notes  °Sickle Cell Patients, [facility] [address], [place] (336) 832-1970   °[facility] [address], [place] (336) 832-4400   °[facility] ° [address], [place] (336) 992-4800   °[facility]/Dr. Osei-Bonsu ° [address] [place] [address], [place] or [address], [place] (336) 841-8500 Phone number for both [place] and [place] locations is the same.  °[facility] [address], [place] (336) 299-0000   °[facility] [address] [place] [address], [place] or [address] (336) 852-7530 °(336) 878-2260   °[facility] [address], [place] (336) 350-1642, phone; (336) 294-5005, fax Sees patients 1st and 3rd Saturday of every month.  Must not qualify for public or private insurance (i.e. Medicaid, Medicare, [place] Health Choice, Veterans' Benefits) • Household income should be no more than 200% of the poverty level •The clinic cannot treat you if you are pregnant or think you are pregnant • Sexually transmitted diseases are not treated at the clinic.  ° ° °Dental Care: °Organization         Address  Phone  Notes  °[place] [facility] [address], [place] (336) 641-6152 Accepts children up to age 21 who are enrolled in Medicaid or [place] Health Choice; pregnant women with a Medicaid card; and children who have applied for Medicaid or [place] Health Choice, but were declined, whose parents can pay a reduced fee at time of service.  °[place]  [facility] [place]  [address], [place] (336) 641-7733 Accepts children up to age 21 who are enrolled in Medicaid or [place] Health Choice; pregnant women with a Medicaid card; and children who have applied for Medicaid or [place] Health Choice, but were declined, whose parents can pay a reduced fee at time of service.  °[facility] ° [address], [place] (336) 641-4533 Patients are seen by appointment only. Walk-ins are not accepted. [facility] will see patients 18 years of age and older. °Monday - Tuesday (8am-5pm) °Most Wednesdays (8:30-5pm) °$30 per visit, cash only  °[facility] ° [address], [place] (336) 641-4533 Patients are seen by appointment only. Walk-ins are not accepted. [facility] will see patients 18 years of age and older. °One   Wednesday Evening (Monthly: Volunteer Based).  $30 per visit, cash only  °[facility]  (919) 537-3737 for adults; Children under age 4, call Graduate Pediatric Dentistry at ([phone]) [phone]. Children aged 4-14, please call ([phone]) [phone] to request a pediatric application. ° Dental services are provided in all areas of dental care including fillings, crowns and bridges, complete and partial dentures, implants, gum treatment, root canals, and extractions. Preventive care is also provided. Treatment is provided to both adults and children. °Patients are selected via a lottery and there is often a waiting list. °  °[facility] [address], °[place] ° (336) 763-8833 [email or web address] °  °[facility] [address], [place], [place] (336)723-1848, Ext. 123 Second and Fourth Thursday of each month, opens at 6:30 AM; Clinic ends at 9 AM.  Patients are seen on a first-come first-served basis, and a limited number are seen during each clinic.  ° °[facility] ° [address], [place], [place] (336) 723-7904    Eligibility Requirements °You must have lived in [place], [place], or [place] counties for at least the last three months. °  You cannot be eligible for state or federal sponsored healthcare insurance, including Veterans Administration, Medicaid, or Medicare. °  You generally cannot be eligible for healthcare insurance through your employer.  °  How to apply: °Eligibility screenings are held every Tuesday and Wednesday afternoon from 1:00 pm until 4:00 pm. You do not need an appointment for the interview!  °[facility] [address], [place], [place] 336-631-2330   °[facility]  336-342-8273   °[facility]  336-703-3100   °[facility]  336-570-6415   ° °Behavioral Health Resources in the Community: °Intensive Outpatient Programs °Organization         Address  Phone  Notes  °[facility] [address], [place], [place] 336-878-6098   °[facility] [address], [place], [place] 336-832-9800   °[facility] [address], [place], [place] ° 336-882-2125   °[facility] [address],  °[place], [place] 1-800-853-5163 or 336-641-4981   °Substance Abuse Resources °Organization         Address  Phone  Notes  °[facility]  336-882-2125   °[facility]  336-784-9470   °[facility]  336-285-9073   °[facility]  336-845-3988   °Residential & Outpatient Substance Abuse Program  1-800-659-3381   °Psychological Services °Organization         Address  Phone  Notes  ° Health  336- 832-9600   °[facility]  336- 378-7881   °[facility] [address], [place] 1-800-853-5163 or 336-641-4981   ° °Mobile Crisis Teams °Organization         Address  Phone  Notes  °[facility], Mobile Crisis Care Unit  1-877-626-1772   °Assertive °[facility] ° [address]  [place], [place] 336-834-9664   °Sharon DeEsch [address] °[place] 336-554-5454   ° °Self-Help/Support Groups °Organization         Address  Phone             Notes  °[facility]  - variety of support groups  336- 373-1402 Call for more information  °Narcotics Anonymous (NA), [facility] [address], °[place]  2 meetings at this location  ° °  Residential Treatment Programs °Organization         Address  Phone  Notes  °[facility] [address],    °[place]  1-866-801-8205   °[facility] ° [address], [place], [place] 704-293-8524   °[facility] [address], [place] 336-845-3988 Admissions: 8am-3pm M-F  °[facility] [address]    °[place], [place] 336-841-1104   °[facility] [address], [place], [place] 336-379-7146   °[facility] [address]  °[place], [place] 336-285-9073   °[facility] Intensive Outpatient [address], [place], [place] 336-852-3033   °[facility] [address]  °[place], [place] 1-877-615-2722 or 336-784-9470   °[facility] [address] [place], [place] 336-227-7417 Accepts Medicaid  °[facility] [address]  °[place] 1-800-659-3381 Substance Abuse/Addiction Treatment  ° °[place] Behavioral Health Resources °Organization         Address  Phone  Notes  °[facility]  (888) 581-9988   °Julie Brannon, PhD [address] [place], [place]   (336) 349-5553 or (336) 951-0000   °[facility]   [address] °[place], [place] (336) 349-4454   °[facility] [address], [place], [place] (336) 342-8316 Insurance/Medicaid/sponsorship through [facility]  °[facility] [address]                                    [place], [place] (336) 342-8316 Therapy/tele-psych/case    °[facility] [address]  ° [place], [place] (336) 349-2233    °Dr. Arfeen  (336) 349-4544   °[facility] [place]  [employer] [place] [facility] 1) [address], [place] °2) [address], [place] °3)  [address], [place] (336) 349-3220 °(336) 342-7768 ° °(336) 342-8140   °[place] [facility] (336) 342-1394 or (336) 342-3537 (After Hours)    ° ° °

## 2014-07-10 NOTE — ED Notes (Signed)
Pt upset upon discharge. Pt A&OX4, NAD noted. Belongings given to pt.

## 2014-07-10 NOTE — ED Notes (Signed)
Called parents to come get pt because he is to be discharged. Parents agreed to come get pt.

## 2014-07-10 NOTE — ED Notes (Signed)
Dr. Patria Mane at bedside talking to pt and family.

## 2016-04-22 DEATH — deceased
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
